# Patient Record
Sex: Male | Born: 1955 | Race: White | Hispanic: No | Marital: Married | Smoking: Current every day smoker
Health system: Southern US, Community
[De-identification: ages and names within clinical notes are randomized; demographics above are authoritative.]

## PROBLEM LIST (undated history)

## (undated) DIAGNOSIS — I2510 Atherosclerotic heart disease of native coronary artery without angina pectoris: Secondary | ICD-10-CM

## (undated) DIAGNOSIS — I10 Essential (primary) hypertension: Secondary | ICD-10-CM

## (undated) DIAGNOSIS — R739 Hyperglycemia, unspecified: Secondary | ICD-10-CM

## (undated) DIAGNOSIS — F17210 Nicotine dependence, cigarettes, uncomplicated: Secondary | ICD-10-CM

## (undated) DIAGNOSIS — F1010 Alcohol abuse, uncomplicated: Secondary | ICD-10-CM

## (undated) DIAGNOSIS — E119 Type 2 diabetes mellitus without complications: Secondary | ICD-10-CM

## (undated) DIAGNOSIS — E785 Hyperlipidemia, unspecified: Secondary | ICD-10-CM

## (undated) HISTORY — PX: CORONARY ANGIOPLASTY WITH STENT PLACEMENT: SHX49

## (undated) HISTORY — PX: LAPAROSCOPIC COLON RESECTION: SUR791

## (undated) HISTORY — PX: COLOSTOMY: SHX63

---

## 2004-03-29 ENCOUNTER — Other Ambulatory Visit: Payer: Self-pay

## 2004-04-05 ENCOUNTER — Inpatient Hospital Stay: Payer: Self-pay | Admitting: General Surgery

## 2004-04-17 ENCOUNTER — Other Ambulatory Visit: Payer: Self-pay

## 2004-05-01 ENCOUNTER — Emergency Department: Payer: Self-pay | Admitting: Emergency Medicine

## 2004-05-19 ENCOUNTER — Ambulatory Visit: Payer: Self-pay | Admitting: General Surgery

## 2004-07-24 ENCOUNTER — Ambulatory Visit: Payer: Self-pay | Admitting: General Surgery

## 2004-09-06 ENCOUNTER — Inpatient Hospital Stay: Payer: Self-pay | Admitting: General Surgery

## 2005-02-06 ENCOUNTER — Ambulatory Visit: Payer: Self-pay | Admitting: General Surgery

## 2005-03-10 IMAGING — CT CT ABD-PELV W/ CM
1 of 3 series · 15 of 32 positions shown, 19 images · non-contrast
Comparison: none

REASON FOR EXAM: back pn  SP colon surg
COMMENTS:

[Series 3: inspace · axial · 0.65mm/px · z∈[-213,+194]mm · 15 of 571 slices shown, 19 images]
[im 41/571  soft-tissue]
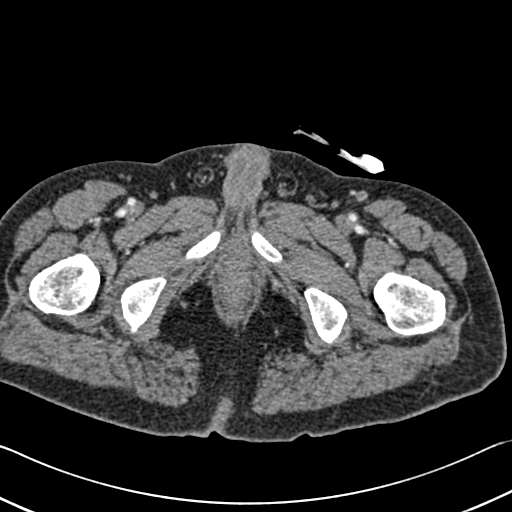
[im 41/571  bone]
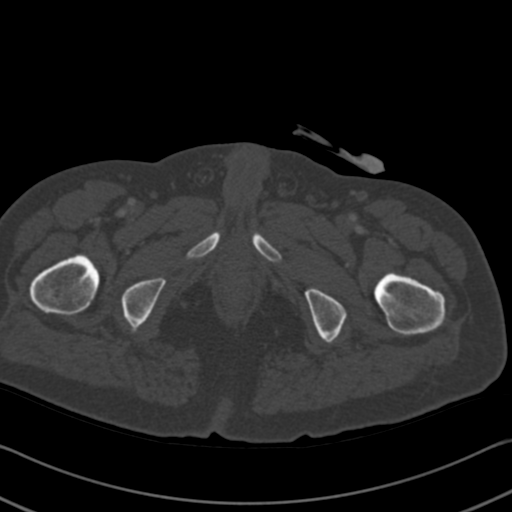
[im 82/571  soft-tissue]
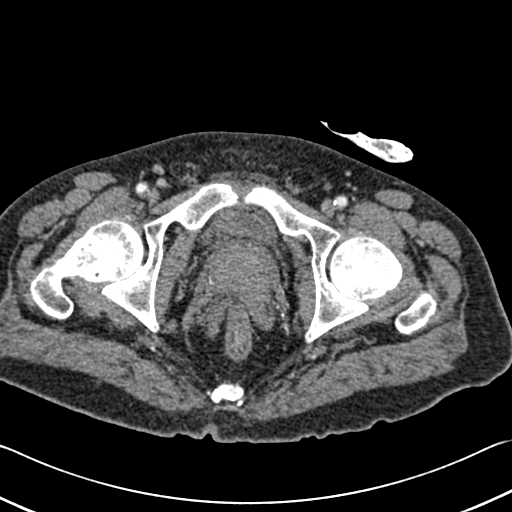
[im 123/571  soft-tissue]
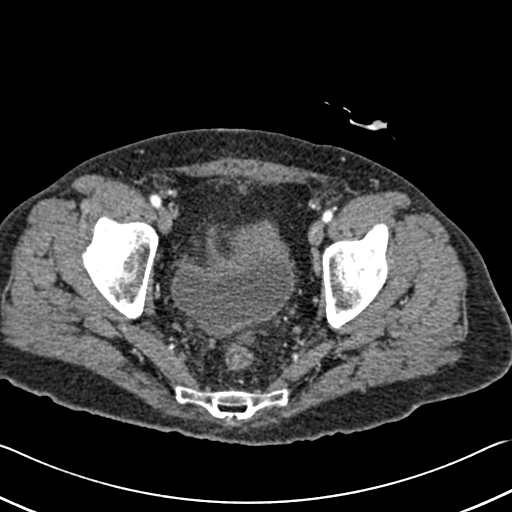
[im 163/571  soft-tissue]
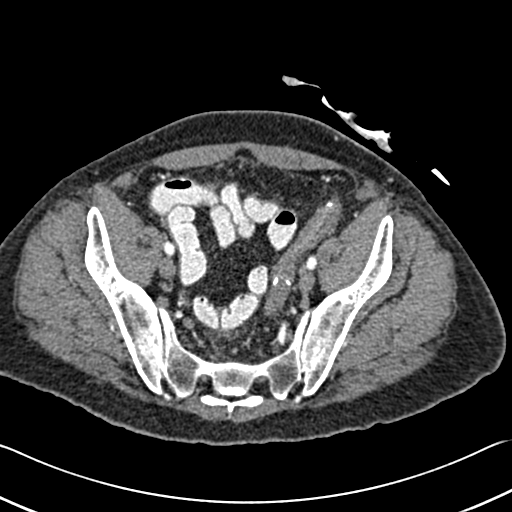
[im 204/571  soft-tissue]
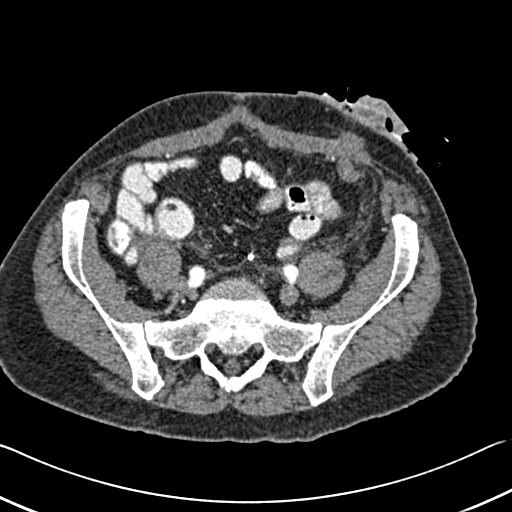
[im 245/571  soft-tissue]
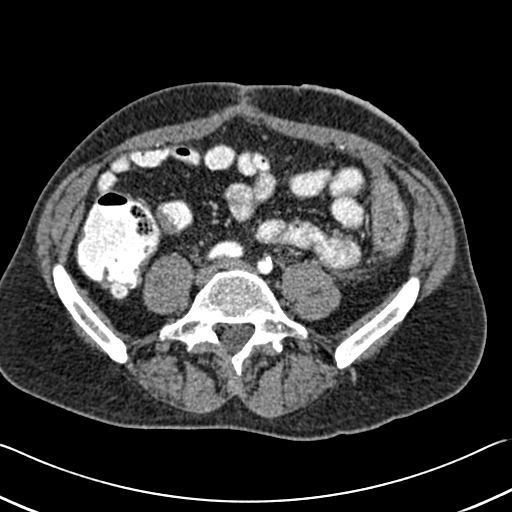
[im 286/571  soft-tissue]
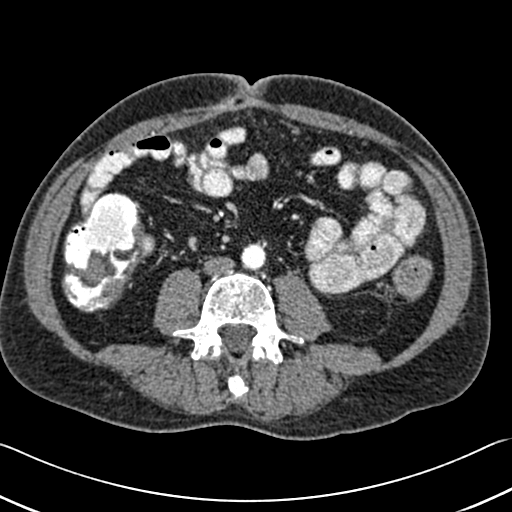
[im 326/571  soft-tissue]
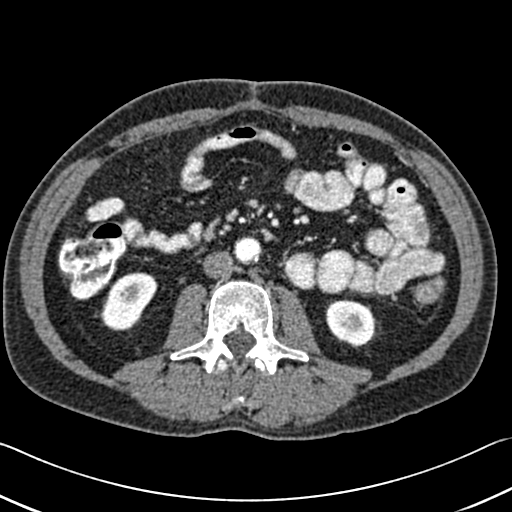
[im 367/571  soft-tissue]
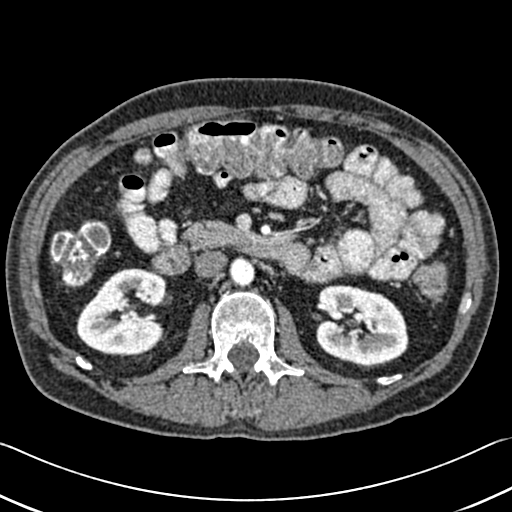
[im 367/571  bone]
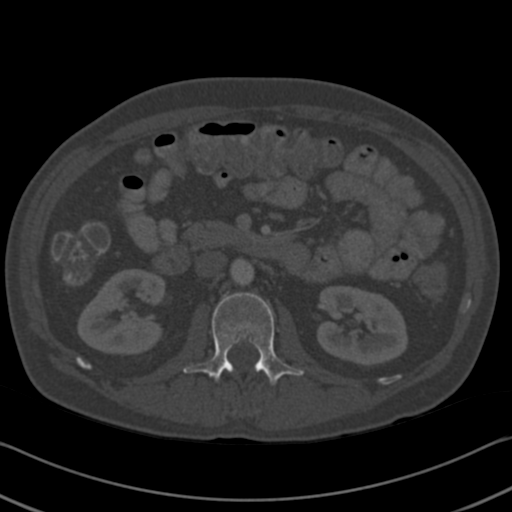
[im 408/571  soft-tissue]
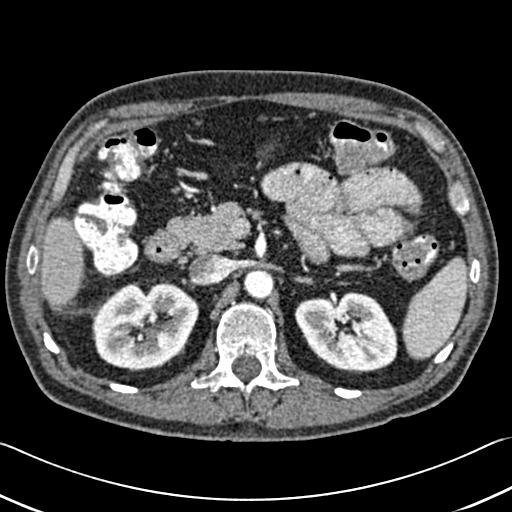
[im 448/571  soft-tissue]
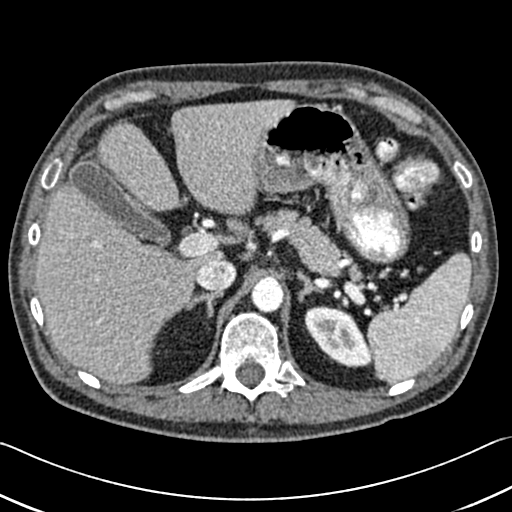
[im 489/571  soft-tissue]
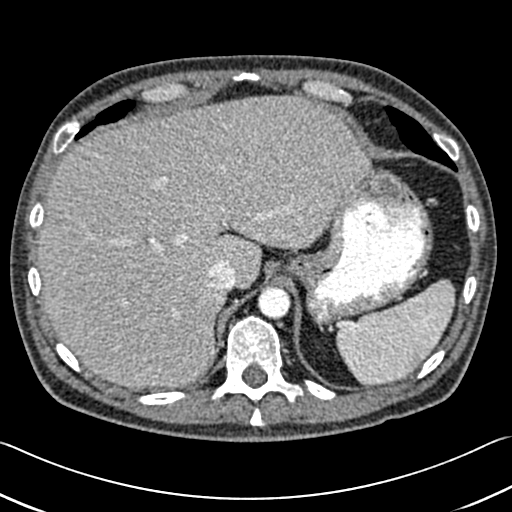
[im 489/571  lung]
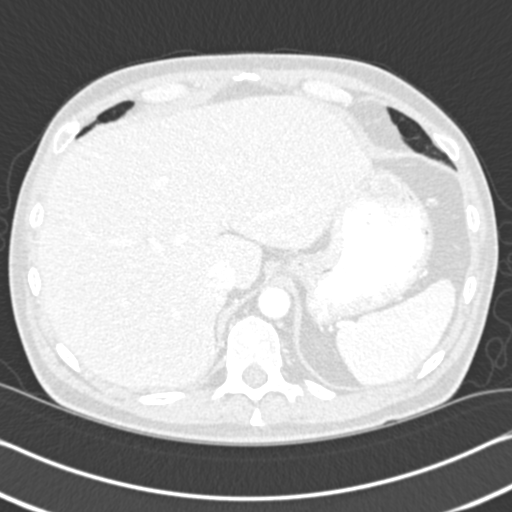
[im 509/571  lung]
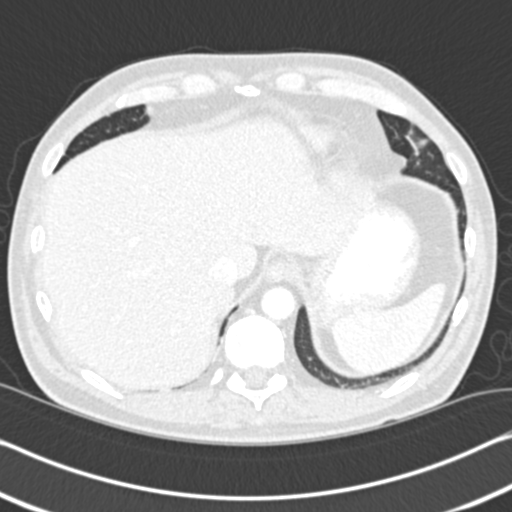
[im 530/571  soft-tissue]
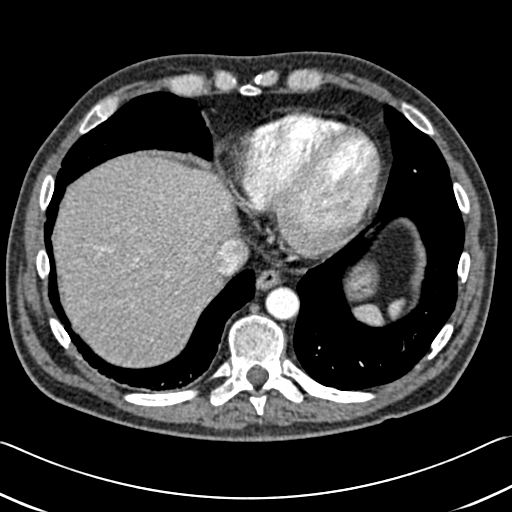
[im 530/571  lung]
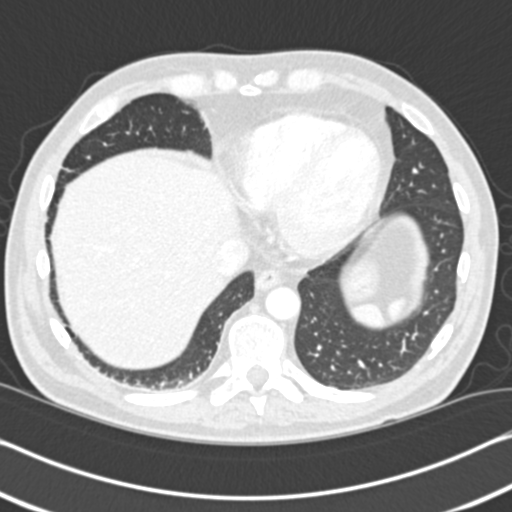
[im 550/571  lung]
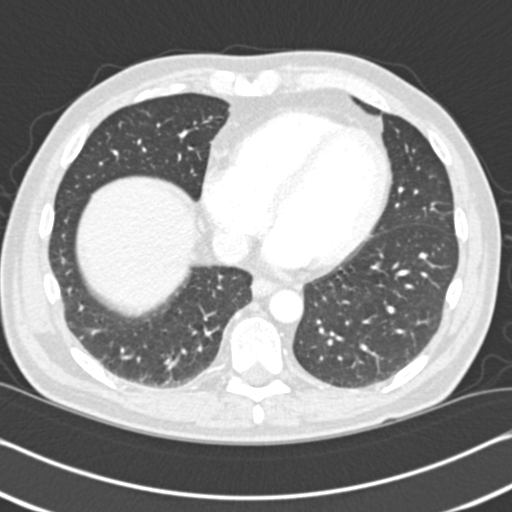

[15 of 32 positions shown; findings below may reference images not displayed]

PROCEDURE:     CT  - CT ABDOMEN / PELVIS  W  - May 19, 2004 [DATE]

RESULT:        8-mm helical cuts through the abdomen and pelvis were
performed with oral and 100 cc of [id number] contrast and compared with a
prior study from 04/12/04 which showed free air consistent with recent
surgery as well as fluid within the pelvis particularly in the LEFT lower
quadrant.
The lung bases are clear on today's study.  Cuts through the abdomen show a
normal appearing liver, spleen, gallbladder, pancreas, adrenals and kidneys.
 No free intraperitoneal fluid, air or adenopathy is identified.  There is a
significant improvement in the all appearance of the abdomen since previous
examination.  I no longer seen stranding of the mesenteric fat or free fluid
in either pericolic gutter.  There is peripheral calcification of the
abdominal aorta without evidence of aneurysmal dilatation.  The previously
described fluid collections within the pelvis have also resolved. The
bladder distends normally without evidence of filling defect or wall
thickening.  The bony structures show mild degenerative change without lytic
or blastic bony lesion.
IMPRESSION: Significant improvement in the all appearance of the abdomen and pelvis
since 04/12/04.  I no longer see evidence of free air or free intraperitoneal
fluid particularly the collections in the pelvis and LEFT lower quadrant
have resolved.  There has also been significant improvement in the overall
appearance of the mesenteric fat.  It was cloudy and stranded before and now
appears well within normal limits.

No new suspicious solid organ abnormality.

The lung bases are clear.

Mild degenerative bony changes without lytic or blastic bony lesion.

## 2007-12-03 ENCOUNTER — Other Ambulatory Visit: Payer: Self-pay

## 2007-12-03 ENCOUNTER — Inpatient Hospital Stay: Payer: Self-pay | Admitting: Internal Medicine

## 2011-03-09 ENCOUNTER — Encounter: Payer: Self-pay | Admitting: *Deleted

## 2012-05-21 ENCOUNTER — Ambulatory Visit: Payer: Self-pay | Admitting: General Surgery

## 2017-01-23 ENCOUNTER — Ambulatory Visit
Admission: RE | Admit: 2017-01-23 | Discharge: 2017-01-23 | Disposition: A | Discharge status: Home / Self Care | Payer: BLUE CROSS/BLUE SHIELD | Source: Ambulatory Visit | Attending: Unknown Physician Specialty | Admitting: Unknown Physician Specialty

## 2017-01-23 ENCOUNTER — Ambulatory Visit: Payer: BLUE CROSS/BLUE SHIELD | Admitting: Anesthesiology

## 2017-01-23 ENCOUNTER — Encounter
Admission: RE | Disposition: A | Discharge status: Home / Self Care | Payer: Self-pay | Source: Ambulatory Visit | Attending: Unknown Physician Specialty

## 2017-01-23 DIAGNOSIS — E785 Hyperlipidemia, unspecified: Secondary | ICD-10-CM | POA: Diagnosis not present

## 2017-01-23 DIAGNOSIS — I2510 Atherosclerotic heart disease of native coronary artery without angina pectoris: Secondary | ICD-10-CM | POA: Insufficient documentation

## 2017-01-23 DIAGNOSIS — Z955 Presence of coronary angioplasty implant and graft: Secondary | ICD-10-CM | POA: Diagnosis not present

## 2017-01-23 DIAGNOSIS — D120 Benign neoplasm of cecum: Secondary | ICD-10-CM | POA: Insufficient documentation

## 2017-01-23 DIAGNOSIS — J449 Chronic obstructive pulmonary disease, unspecified: Secondary | ICD-10-CM | POA: Diagnosis not present

## 2017-01-23 DIAGNOSIS — Z79899 Other long term (current) drug therapy: Secondary | ICD-10-CM | POA: Insufficient documentation

## 2017-01-23 DIAGNOSIS — Z7982 Long term (current) use of aspirin: Secondary | ICD-10-CM | POA: Diagnosis not present

## 2017-01-23 DIAGNOSIS — Z800 Family history of malignant neoplasm of digestive organs: Secondary | ICD-10-CM | POA: Diagnosis not present

## 2017-01-23 DIAGNOSIS — K621 Rectal polyp: Secondary | ICD-10-CM | POA: Diagnosis not present

## 2017-01-23 DIAGNOSIS — K648 Other hemorrhoids: Secondary | ICD-10-CM | POA: Insufficient documentation

## 2017-01-23 DIAGNOSIS — Z1211 Encounter for screening for malignant neoplasm of colon: Secondary | ICD-10-CM | POA: Diagnosis present

## 2017-01-23 DIAGNOSIS — Z86010 Personal history of colonic polyps: Secondary | ICD-10-CM | POA: Diagnosis not present

## 2017-01-23 DIAGNOSIS — F17210 Nicotine dependence, cigarettes, uncomplicated: Secondary | ICD-10-CM | POA: Diagnosis not present

## 2017-01-23 DIAGNOSIS — F329 Major depressive disorder, single episode, unspecified: Secondary | ICD-10-CM | POA: Diagnosis not present

## 2017-01-23 DIAGNOSIS — I10 Essential (primary) hypertension: Secondary | ICD-10-CM | POA: Diagnosis not present

## 2017-01-23 DIAGNOSIS — E119 Type 2 diabetes mellitus without complications: Secondary | ICD-10-CM | POA: Insufficient documentation

## 2017-01-23 DIAGNOSIS — D122 Benign neoplasm of ascending colon: Secondary | ICD-10-CM | POA: Insufficient documentation

## 2017-01-23 DIAGNOSIS — Z7984 Long term (current) use of oral hypoglycemic drugs: Secondary | ICD-10-CM | POA: Insufficient documentation

## 2017-01-23 HISTORY — PX: COLONOSCOPY WITH PROPOFOL: SHX5780

## 2017-01-23 HISTORY — DX: Hyperlipidemia, unspecified: E78.5

## 2017-01-23 HISTORY — DX: Essential (primary) hypertension: I10

## 2017-01-23 HISTORY — DX: Type 2 diabetes mellitus without complications: E11.9

## 2017-01-23 HISTORY — DX: Hyperglycemia, unspecified: R73.9

## 2017-01-23 HISTORY — DX: Nicotine dependence, cigarettes, uncomplicated: F17.210

## 2017-01-23 HISTORY — DX: Alcohol abuse, uncomplicated: F10.10

## 2017-01-23 HISTORY — DX: Atherosclerotic heart disease of native coronary artery without angina pectoris: I25.10

## 2017-01-23 LAB — GLUCOSE, CAPILLARY: GLUCOSE-CAPILLARY: 266 mg/dL — AB (ref 65–99)

## 2017-01-23 SURGERY — COLONOSCOPY WITH PROPOFOL
Anesthesia: General

## 2017-01-23 MED ORDER — LIDOCAINE HCL (PF) 1 % IJ SOLN: 2.0000 mL | Freq: Once | INTRAMUSCULAR | Status: DC

## 2017-01-23 MED ORDER — FENTANYL CITRATE (PF) 100 MCG/2ML IJ SOLN
INTRAMUSCULAR | Status: AC
  Filled 2017-01-23: qty 2

## 2017-01-23 MED ORDER — PHENYLEPHRINE HCL 10 MG/ML IJ SOLN
INTRAMUSCULAR | Status: AC
  Filled 2017-01-23: qty 1

## 2017-01-23 MED ORDER — SODIUM CHLORIDE 0.9 % IV SOLN: INTRAVENOUS | Status: DC

## 2017-01-23 MED ORDER — MIDAZOLAM HCL 2 MG/2ML IJ SOLN
INTRAMUSCULAR | Status: DC | PRN
  Administered 2017-01-23: 2 mg via INTRAVENOUS

## 2017-01-23 MED ORDER — MIDAZOLAM HCL 2 MG/2ML IJ SOLN
INTRAMUSCULAR | Status: AC
  Filled 2017-01-23: qty 2

## 2017-01-23 MED ORDER — SODIUM CHLORIDE 0.9 % IV SOLN
INTRAVENOUS | Status: DC
  Administered 2017-01-23: 09:00:00 via INTRAVENOUS

## 2017-01-23 MED ORDER — PROPOFOL 500 MG/50ML IV EMUL
INTRAVENOUS | Status: AC
  Filled 2017-01-23: qty 50

## 2017-01-23 MED ORDER — PHENYLEPHRINE HCL 10 MG/ML IJ SOLN
INTRAMUSCULAR | Status: DC | PRN
  Administered 2017-01-23: 100 ug via INTRAVENOUS

## 2017-01-23 MED ORDER — PROPOFOL 500 MG/50ML IV EMUL
INTRAVENOUS | Status: DC | PRN
  Administered 2017-01-23: 120 ug/kg/min via INTRAVENOUS

## 2017-01-23 MED ORDER — FENTANYL CITRATE (PF) 100 MCG/2ML IJ SOLN
INTRAMUSCULAR | Status: DC | PRN
  Administered 2017-01-23: 100 ug via INTRAVENOUS

## 2017-01-23 MED ORDER — PROPOFOL 10 MG/ML IV BOLUS
INTRAVENOUS | Status: DC | PRN
  Administered 2017-01-23: 30 mg via INTRAVENOUS

## 2017-01-23 NOTE — Anesthesia Preprocedure Evaluation (Signed)
Anesthesia Evaluation  Patient identified by MRN, date of birth, ID band Patient awake    Reviewed: Allergy & Precautions, NPO status , Patient's Chart, lab work & pertinent test results  Airway Mallampati: II       Dental  (+) Teeth Intact   Pulmonary COPD, Current Smoker,     + decreased breath sounds      Cardiovascular Exercise Tolerance: Good hypertension, Pt. on home beta blockers + CAD   Rhythm:Regular     Neuro/Psych Depression    GI/Hepatic negative GI ROS, Neg liver ROS,   Endo/Other  diabetes, Type 2, Oral Hypoglycemic Agents  Renal/GU negative Renal ROS     Musculoskeletal   Abdominal Normal abdominal exam  (+)   Peds negative pediatric ROS (+)  Hematology   Anesthesia Other Findings   Reproductive/Obstetrics                             Anesthesia Physical Anesthesia Plan  ASA: III  Anesthesia Plan: General   Post-op Pain Management:    Induction: Intravenous  PONV Risk Score and Plan: 0  Airway Management Planned: Natural Airway and Nasal Cannula  Additional Equipment:   Intra-op Plan:   Post-operative Plan:   Informed Consent: I have reviewed the patients History and Physical, chart, labs and discussed the procedure including the risks, benefits and alternatives for the proposed anesthesia with the patient or authorized representative who has indicated his/her understanding and acceptance.     Plan Discussed with: Surgeon  Anesthesia Plan Comments:         Anesthesia Quick Evaluation

## 2017-01-23 NOTE — Op Note (Signed)
[facility] Gastroenterology Patient Name: Adam Mitchell Procedure Date: 01/23/2017 9:01 AM MRN: [id number] Account #: [id number] Date of Birth: 03-19-1956 Admit Type: Outpatient Age: 61 Room: [facility] ENDO ROOM 3 Gender: Male Note Status: Finalized Procedure:            Colonoscopy Indications:          High risk colon cancer surveillance: Personal history                        of colonic polyps Providers:            Manya Silvas, MD Referring MD:         Kerin Perna MD, MD (Referring MD) Medicines:            Propofol per Anesthesia Complications:        No immediate complications. Procedure:            Pre-Anesthesia Assessment:                       - After reviewing the risks and benefits, the patient                        was deemed in satisfactory condition to undergo the                        procedure.                       After obtaining informed consent, the colonoscope was                        passed under direct vision. Throughout the procedure,                        the patient's blood pressure, pulse, and oxygen                        saturations were monitored continuously. The                        Colonoscope was introduced through the anus and                        advanced to the the cecum, identified by appendiceal                        orifice and ileocecal valve. The colonoscopy was                        performed without difficulty. The patient tolerated the                        procedure well. The quality of the bowel preparation                        was excellent. Findings:      Changes of previous surgery were seen, no impediment to the exam.      A diminutive polyp was found in the ascending colon. The polyp was       sessile. The polyp was removed with a jumbo cold forceps. Resection and  retrieval were complete.      A diminutive polyp was found in the rectum. The polyp was sessile. The       polyp was  removed with a jumbo cold forceps. Resection and retrieval       were complete. Impression:           - Two diminutive, non-bleeding polyps in the rectum and                        in the cecum, removed with a jumbo cold forceps.                        Resected and retrieved.                       - Internal hemorrhoids.                       - The examination was otherwise normal. Recommendation:       - Await pathology results. Manya Silvas, MD 01/23/2017 9:39:28 AM This report has been signed electronically. Number of Addenda: 0 Note Initiated On: 01/23/2017 9:01 AM Scope Withdrawal Time: 0 hours 7 minutes 58 seconds  Total Procedure Duration: 0 hours 16 minutes 0 seconds       [facility]

## 2017-01-23 NOTE — Transfer of Care (Signed)
Immediate Anesthesia Transfer of Care Note  Patient: Adam Mitchell  Procedure(s) Performed: Procedure(s): COLONOSCOPY WITH PROPOFOL (N/A)  Patient Location: PACU  Anesthesia Type:General  Level of Consciousness: awake  Airway & Oxygen Therapy: Patient Spontanous Breathing and Patient connected to nasal cannula oxygen  Post-op Assessment: Report given to RN and Post -op Vital signs reviewed and stable  Post vital signs: Reviewed  Last Vitals:  Vitals:   01/23/17 0930 01/23/17 0938  BP: 126/73   Pulse: 73   Resp: 14   Temp:  (!) 36.1 C    Last Pain:  Vitals:   01/23/17 0938  TempSrc: Tympanic         Complications: No apparent anesthesia complications

## 2017-01-23 NOTE — Anesthesia Postprocedure Evaluation (Signed)
Anesthesia Post Note  Patient: Adam Mitchell  Procedure(s) Performed: Procedure(s) (LRB): COLONOSCOPY WITH PROPOFOL (N/A)  Patient location during evaluation: PACU Anesthesia Type: General Level of consciousness: awake Pain management: pain level controlled Vital Signs Assessment: post-procedure vital signs reviewed and stable Respiratory status: spontaneous breathing Cardiovascular status: stable Anesthetic complications: no     Last Vitals:  Vitals:   01/23/17 0958 01/23/17 1008  BP: (!) 139/92 138/86  Pulse: 62 62  Resp: 16 17  Temp:      Last Pain:  Vitals:   01/23/17 0938  TempSrc: Tympanic                 VAN STAVEREN,Brittanya Winburn

## 2017-01-23 NOTE — Anesthesia Post-op Follow-up Note (Signed)
Anesthesia QCDR form completed.        

## 2017-01-23 NOTE — H&P (Signed)
Primary Care Physician:  Hortencia Pilar, MD Primary Gastroenterologist:  Dr. Vira Agar  Pre-Procedure History & Physical: HPI:  Adam Mitchell is a 61 y.o. male is here for an colonoscopy.   Past Medical History:  Diagnosis Date  . Alcohol abuse   . Cigarette smoker   . Coronary artery disease   . Diabetes mellitus without complication ([place])   . Hyperglycemia   . Hyperlipidemia   . Hypertension     Past Surgical History:  Procedure Laterality Date  . COLOSTOMY     due to diverticulitis   . CORONARY ANGIOPLASTY WITH STENT PLACEMENT     s/p placement of Xience stent in proximal LAD 12/05/07  . LAPAROSCOPIC COLON RESECTION     w/splenic takedown     Prior to Admission medications   Medication Sig Start Date End Date Taking? Authorizing Provider  aspirin EC 81 MG tablet Take 81 mg by mouth daily.   Yes [provider]  atenolol (TENORMIN) 25 MG tablet Take by mouth daily.   Yes [provider]  blood glucose meter kit and supplies by Other route as directed. Dispense based on patient and insurance preference. Use up to four times daily as directed. (FOR ICD-9 250.00, 250.01).   Yes [provider]  clopidogrel (PLAVIX) 75 MG tablet Take 75 mg by mouth daily.   Yes [provider]  glipiZIDE (GLUCOTROL) 5 MG tablet Take by mouth daily before breakfast.   Yes [provider]  glucose blood test strip 1 each by Other route as needed for other. Use as instructed   Yes [provider]  Lancets Misc. MISC by Does not apply route.   Yes [provider]  ramipril (ALTACE) 2.5 MG capsule Take 2.5 mg by mouth daily.   Yes [provider]  saxagliptin HCl (ONGLYZA) 2.5 MG TABS tablet Take 5 mg by mouth daily.   Yes [provider]  sildenafil (VIAGRA) 100 MG tablet Take 100 mg by mouth daily as needed for erectile dysfunction.   Yes [provider]  simvastatin (ZOCOR) 20 MG tablet Take 20 mg by mouth  daily.   Yes [provider]    Allergies as of 10/01/2016  . (Not on File)    Family History  Problem Relation Age of Onset  . Pancreatic cancer Mother   . Heart failure Father   . Heart failure Maternal Grandmother   . Depression Paternal Grandfather     Social History   Social History  . Marital status: Married    Spouse name: N/A  . Number of children: N/A  . Years of education: N/A   Occupational History  . Not on file.   Social History Main Topics  . Smoking status: Current Every Day Smoker    Packs/day: 2.00  . Smokeless tobacco: Never Used  . Alcohol use Yes  . Drug use: No  . Sexual activity: Not on file   Other Topics Concern  . Not on file   Social History Narrative  . No narrative on file    Review of Systems: See HPI, otherwise negative ROS  Physical Exam: BP (!) 105/52   Pulse 82   Temp (!) 96.7 F (35.9 C)   Resp 18   Ht '5\' 7"'  (1.702 m)   Wt 72.6 kg (160 lb)   SpO2 99%   BMI 25.06 kg/m  General:   Alert,  pleasant and cooperative in NAD Head:  Normocephalic and atraumatic. Neck:  Supple;  no masses or thyromegaly. Lungs:  Clear throughout to auscultation.    Heart:  Regular rate and rhythm. Abdomen:  Soft, nontender and nondistended. Normal bowel sounds, without guarding, and without rebound.   Neurologic:  Alert and  oriented x4;  grossly normal neurologically.  Impression/Plan: Adam Mitchell is here for an colonoscopy to be performed for [facility] colon polyps.  Risks, benefits, limitations, and alternatives regarding  colonoscopy have been reviewed with the patient.  Questions have been answered.  All parties agreeable.   Gaylyn Cheers, MD  01/23/2017, 9:05 AM

## 2017-01-24 ENCOUNTER — Encounter: Payer: Self-pay | Admitting: Unknown Physician Specialty

## 2017-01-24 LAB — SURGICAL PATHOLOGY

## 2019-12-30 ENCOUNTER — Other Ambulatory Visit: Payer: Self-pay | Admitting: Family Medicine

## 2019-12-30 DIAGNOSIS — E119 Type 2 diabetes mellitus without complications: Secondary | ICD-10-CM

## 2019-12-30 DIAGNOSIS — Z136 Encounter for screening for cardiovascular disorders: Secondary | ICD-10-CM

## 2020-01-08 ENCOUNTER — Ambulatory Visit
Admission: RE | Admit: 2020-01-08 | Discharge: 2020-01-08 | Disposition: A | Discharge status: Home / Self Care | Payer: Medicare Other | Source: Ambulatory Visit | Attending: Family Medicine | Admitting: Family Medicine

## 2020-01-08 ENCOUNTER — Other Ambulatory Visit: Payer: Self-pay

## 2020-01-08 DIAGNOSIS — E119 Type 2 diabetes mellitus without complications: Secondary | ICD-10-CM | POA: Insufficient documentation

## 2020-01-08 DIAGNOSIS — Z136 Encounter for screening for cardiovascular disorders: Secondary | ICD-10-CM | POA: Insufficient documentation

## 2020-01-12 ENCOUNTER — Other Ambulatory Visit: Payer: Self-pay | Admitting: Pediatrics

## 2022-03-02 ENCOUNTER — Other Ambulatory Visit: Payer: Self-pay | Admitting: *Deleted

## 2022-03-02 DIAGNOSIS — Z87891 Personal history of nicotine dependence: Secondary | ICD-10-CM

## 2022-03-02 DIAGNOSIS — Z122 Encounter for screening for malignant neoplasm of respiratory organs: Secondary | ICD-10-CM

## 2022-03-02 DIAGNOSIS — F17210 Nicotine dependence, cigarettes, uncomplicated: Secondary | ICD-10-CM

## 2022-03-20 ENCOUNTER — Encounter: Payer: Self-pay | Admitting: Ophthalmology

## 2022-03-20 ENCOUNTER — Other Ambulatory Visit: Payer: Self-pay

## 2022-03-21 NOTE — Discharge Instructions (Signed)

## 2022-03-27 ENCOUNTER — Encounter
Admission: RE | Disposition: A | Discharge status: Home / Self Care | Payer: Self-pay | Source: Home / Self Care | Attending: Ophthalmology

## 2022-03-27 ENCOUNTER — Encounter: Payer: Self-pay | Admitting: Ophthalmology

## 2022-03-27 ENCOUNTER — Ambulatory Visit: Payer: Medicare Other | Admitting: General Practice

## 2022-03-27 ENCOUNTER — Ambulatory Visit (AMBULATORY_SURGERY_CENTER): Payer: Medicare Other | Admitting: General Practice

## 2022-03-27 ENCOUNTER — Other Ambulatory Visit: Payer: Self-pay

## 2022-03-27 ENCOUNTER — Ambulatory Visit
Admission: RE | Admit: 2022-03-27 | Discharge: 2022-03-27 | Disposition: A | Discharge status: Home / Self Care | Payer: Medicare Other | Attending: Ophthalmology | Admitting: Ophthalmology

## 2022-03-27 DIAGNOSIS — I10 Essential (primary) hypertension: Secondary | ICD-10-CM | POA: Diagnosis not present

## 2022-03-27 DIAGNOSIS — E1136 Type 2 diabetes mellitus with diabetic cataract: Secondary | ICD-10-CM | POA: Insufficient documentation

## 2022-03-27 DIAGNOSIS — E785 Hyperlipidemia, unspecified: Secondary | ICD-10-CM | POA: Insufficient documentation

## 2022-03-27 DIAGNOSIS — H2512 Age-related nuclear cataract, left eye: Secondary | ICD-10-CM | POA: Diagnosis present

## 2022-03-27 DIAGNOSIS — I2510 Atherosclerotic heart disease of native coronary artery without angina pectoris: Secondary | ICD-10-CM | POA: Diagnosis not present

## 2022-03-27 DIAGNOSIS — F17210 Nicotine dependence, cigarettes, uncomplicated: Secondary | ICD-10-CM | POA: Diagnosis not present

## 2022-03-27 DIAGNOSIS — E119 Type 2 diabetes mellitus without complications: Secondary | ICD-10-CM | POA: Diagnosis not present

## 2022-03-27 HISTORY — PX: CATARACT EXTRACTION W/PHACO: SHX586

## 2022-03-27 LAB — GLUCOSE, CAPILLARY
Glucose-Capillary: 195 mg/dL — ABNORMAL HIGH (ref 70–99)
Glucose-Capillary: 209 mg/dL — ABNORMAL HIGH (ref 70–99)

## 2022-03-27 SURGERY — PHACOEMULSIFICATION, CATARACT, WITH IOL INSERTION
Anesthesia: Monitor Anesthesia Care | Site: Eye | Laterality: Left

## 2022-03-27 MED ORDER — SIGHTPATH DOSE#1 NA CHONDROIT SULF-NA HYALURON 40-17 MG/ML IO SOLN
INTRAOCULAR | Status: DC | PRN
  Administered 2022-03-27: 1 mL via INTRAOCULAR

## 2022-03-27 MED ORDER — SIGHTPATH DOSE#1 BSS IO SOLN
INTRAOCULAR | Status: DC | PRN
  Administered 2022-03-27: 1 mL via INTRAMUSCULAR

## 2022-03-27 MED ORDER — ARMC OPHTHALMIC DILATING DROPS
1.0000 | OPHTHALMIC | Status: DC | PRN
Start: 2022-03-27 — End: 2022-03-27
  Administered 2022-03-27 (×3): 1 via OPHTHALMIC

## 2022-03-27 MED ORDER — ONDANSETRON HCL 4 MG/2ML IJ SOLN: 4.0000 mg | Freq: Once | INTRAMUSCULAR | Status: DC | PRN

## 2022-03-27 MED ORDER — FENTANYL CITRATE PF 50 MCG/ML IJ SOSY: 25.0000 ug | PREFILLED_SYRINGE | INTRAMUSCULAR | Status: DC | PRN

## 2022-03-27 MED ORDER — BRIMONIDINE TARTRATE-TIMOLOL 0.2-0.5 % OP SOLN
OPHTHALMIC | Status: DC | PRN
  Administered 2022-03-27: 1 [drp] via OPHTHALMIC

## 2022-03-27 MED ORDER — FENTANYL CITRATE (PF) 100 MCG/2ML IJ SOLN
INTRAMUSCULAR | Status: DC | PRN
  Administered 2022-03-27 (×2): 50 ug via INTRAVENOUS

## 2022-03-27 MED ORDER — SIGHTPATH DOSE#1 BSS IO SOLN
INTRAOCULAR | Status: DC | PRN
  Administered 2022-03-27: 94 mL via OPHTHALMIC

## 2022-03-27 MED ORDER — MOXIFLOXACIN HCL 0.5 % OP SOLN
OPHTHALMIC | Status: DC | PRN
  Administered 2022-03-27: 0.2 mL via OPHTHALMIC

## 2022-03-27 MED ORDER — MIDAZOLAM HCL 2 MG/2ML IJ SOLN
INTRAMUSCULAR | Status: DC | PRN
  Administered 2022-03-27: 2 mg via INTRAVENOUS

## 2022-03-27 MED ORDER — SIGHTPATH DOSE#1 BSS IO SOLN
INTRAOCULAR | Status: DC | PRN
  Administered 2022-03-27: 15 mL

## 2022-03-27 MED ORDER — TETRACAINE HCL 0.5 % OP SOLN
1.0000 [drp] | OPHTHALMIC | Status: DC | PRN
  Administered 2022-03-27 (×3): 1 [drp] via OPHTHALMIC

## 2022-03-27 SURGICAL SUPPLY — 9 items
CATARACT SUITE SIGHTPATH (MISCELLANEOUS) ×1 IMPLANT
FEE CATARACT SUITE SIGHTPATH (MISCELLANEOUS) ×1 IMPLANT
GLOVE SURG ENC TEXT LTX SZ8 (GLOVE) ×1 IMPLANT
GLOVE SURG TRIUMPH 8.0 PF LTX (GLOVE) ×1 IMPLANT
LENS IOL TECNIS EYHANCE 18.0 (Intraocular Lens) IMPLANT
NDL FILTER BLUNT 18X1 1/2 (NEEDLE) ×1 IMPLANT
NEEDLE FILTER BLUNT 18X1 1/2 (NEEDLE) ×1 IMPLANT
SYR 3ML LL SCALE MARK (SYRINGE) ×1 IMPLANT
WATER STERILE IRR 250ML POUR (IV SOLUTION) ×1 IMPLANT

## 2022-03-27 NOTE — H&P (Signed)
[facility]   Primary Care Physician:  Sherre Scarlet, PA-C Ophthalmologist: Dr. George Ina  Pre-Procedure History & Physical: HPI:  Adam Mitchell is a 66 y.o. male here for cataract surgery.   Past Medical History:  Diagnosis Date   Alcohol abuse    Cigarette smoker    Coronary artery disease    Diabetes mellitus without complication ([place])    Hyperglycemia    Hyperlipidemia    Hypertension     Past Surgical History:  Procedure Laterality Date   COLONOSCOPY WITH PROPOFOL N/A 01/23/2017   Procedure: COLONOSCOPY WITH PROPOFOL;  Surgeon: Manya Silvas, MD;  Location: [facility] ENDOSCOPY;  Service: Endoscopy;  Laterality: N/A;   COLOSTOMY     due to diverticulitis    CORONARY ANGIOPLASTY WITH STENT PLACEMENT     s/p placement of Xience stent in proximal LAD 12/05/07   LAPAROSCOPIC COLON RESECTION     w/splenic takedown     Prior to Admission medications   Medication Sig Start Date End Date Taking? Authorizing Provider  aspirin EC 81 MG tablet Take 81 mg by mouth daily.   Yes [provider]  atenolol (TENORMIN) 25 MG tablet Take by mouth daily.   Yes [provider]  clopidogrel (PLAVIX) 75 MG tablet Take 75 mg by mouth daily.   Yes [provider]  empagliflozin (JARDIANCE) 10 MG TABS tablet Take by mouth daily.   Yes [provider]  glipiZIDE (GLUCOTROL) 5 MG tablet Take by mouth daily before breakfast.   Yes [provider]  glucose blood test strip 1 each by Other route as needed for other. Use as instructed   Yes [provider]  Lancets Misc. MISC by Does not apply route.   Yes [provider]  ramipril (ALTACE) 2.5 MG capsule Take 2.5 mg by mouth daily.   Yes [provider]  saxagliptin HCl (ONGLYZA) 2.5 MG TABS tablet Take 5 mg by mouth daily.   Yes [provider]  simvastatin (ZOCOR) 20 MG tablet Take 20 mg by mouth daily.   Yes [provider]  blood glucose meter  kit and supplies by Other route as directed. Dispense based on patient and insurance preference. Use up to four times daily as directed. (FOR ICD-9 250.00, 250.01).    [provider]  sildenafil (VIAGRA) 100 MG tablet Take 100 mg by mouth daily as needed for erectile dysfunction.    [provider]    Allergies as of 02/05/2022 - Review Complete 01/23/2017  Allergen Reaction Noted   Metformin and related  01/22/2017    Family History  Problem Relation Age of Onset   Pancreatic cancer Mother    Heart failure Father    Heart failure Maternal Grandmother    Depression Paternal Grandfather     Social History   Socioeconomic History   Marital status: Married    Spouse name: Not on file   Number of children: Not on file   Years of education: Not on file   Highest education level: Not on file  Occupational History   Not on file  Tobacco Use   Smoking status: Every Day    Packs/day: 2.00    Types: Cigarettes   Smokeless tobacco: Never  Substance and Sexual Activity   Alcohol use: Yes    Alcohol/week: 84.0 standard drinks of alcohol    Types: 84 Cans of beer per week   Drug use: No   Sexual activity: Not on file  Other  Topics Concern   Not on file  Social History Narrative   Not on file   Social Determinants of Health   Financial Resource Strain: Not on file  Food Insecurity: Not on file  Transportation Needs: Not on file  Physical Activity: Not on file  Stress: Not on file  Social Connections: Not on file  Intimate Partner Violence: Not on file    Review of Systems: See HPI, otherwise negative ROS  Physical Exam: BP (!) 163/80   Pulse 90   Temp (!) 97.3 F (36.3 C) (Temporal)   Ht _0  (1.676 m)   Wt 79.7 kg   SpO2 96%   BMI 28.34 kg/m  General:   Alert, cooperative in NAD Head:  Normocephalic and atraumatic. Respiratory:  Normal work of breathing. Cardiovascular:  RRR  Impression/Plan: Adam Mitchell is here for cataract  surgery.  Risks, benefits, limitations, and alternatives regarding cataract surgery have been reviewed with the patient.  Questions have been answered.  All parties agreeable.   Birder Robson, MD  03/27/2022, 7:55 AM

## 2022-03-27 NOTE — Anesthesia Postprocedure Evaluation (Signed)
Anesthesia Post Note  Patient: Adam Mitchell  Procedure(s) Performed: CATARACT EXTRACTION PHACO AND INTRAOCULAR LENS PLACEMENT (IOC) LEFT DIABETIC 8.22 00:58.3 (Left: Eye)     Anesthesia Type: MAC Anesthetic complications: no   There were no known notable events for this encounter.  Molli Barrows

## 2022-03-27 NOTE — Op Note (Signed)
PREOPERATIVE DIAGNOSIS:  Nuclear sclerotic cataract of the left eye.   POSTOPERATIVE DIAGNOSIS:  Nuclear sclerotic cataract of the left eye.   OPERATIVE PROCEDURE:ORPROCALL@   SURGEON:  Birder Robson, MD.   ANESTHESIA:  Anesthesiologist: Molli Barrows, MD CRNA: Ester Rink, CRNA  1.      Managed anesthesia care. 2.     0.31m of Shugarcaine was instilled following the paracentesis   COMPLICATIONS:  None.   TECHNIQUE:   Stop and chop   DESCRIPTION OF PROCEDURE:  The patient was examined and consented in the preoperative holding area where the aforementioned topical anesthesia was applied to the left eye and then brought back to the Operating Room where the left eye was prepped and draped in the usual sterile ophthalmic fashion and a lid speculum was placed. A paracentesis was created with the side port blade and the anterior chamber was filled with viscoelastic. A near clear corneal incision was performed with the steel keratome. A continuous curvilinear capsulorrhexis was performed with a cystotome followed by the capsulorrhexis forceps. Hydrodissection and hydrodelineation were carried out with BSS on a blunt cannula. The lens was removed in a stop and chop  technique and the remaining cortical material was removed with the irrigation-aspiration handpiece. The capsular bag was inflated with viscoelastic and the Technis [id number] lens was placed in the capsular bag without complication. The remaining viscoelastic was removed from the eye with the irrigation-aspiration handpiece. The wounds were hydrated. The anterior chamber was flushed with BSS and the eye was inflated to physiologic pressure. 0.110mVigamox was placed in the anterior chamber. The wounds were found to be water tight. The eye was dressed with Combigan. The patient was given protective glasses to wear throughout the day and a shield with which to sleep tonight. The patient was also given drops with which to begin a drop regimen  today and will follow-up with me in one day. Implant Name Type Inv. Item Serial No. Manufacturer Lot No. LRB No. Used Action  LENS IOL TECNIS EYHANCE 18.0 - [id number] Lens LENS IOL TECNIS EYHANCE 18.0 [id number]  Left 1 Implanted    Procedure(s) with comments: CATARACT EXTRACTION PHACO AND INTRAOCULAR LENS PLACEMENT (IOC) LEFT DIABETIC 8.22 00:58.3 (Left) - DIABETIC  Electronically signed: WiBirder Robson/19/2023 8:23 AM

## 2022-03-27 NOTE — Anesthesia Preprocedure Evaluation (Signed)
Anesthesia Evaluation  Patient identified by MRN, date of birth, ID band Patient awake    Reviewed: Allergy & Precautions, H&P , NPO status , Patient's Chart, lab work & pertinent test results, reviewed documented beta blocker date and time   Airway Mallampati: II  TM Distance: >3 FB Neck ROM: full    Dental no notable dental hx. (+) Teeth Intact   Pulmonary neg pulmonary ROS, Current Smoker,    Pulmonary exam normal breath sounds clear to auscultation       Cardiovascular Exercise Tolerance: Good hypertension, On Medications + CAD   Rhythm:regular Rate:Normal     Neuro/Psych negative neurological ROS  negative psych ROS   GI/Hepatic negative GI ROS, Neg liver ROS,   Endo/Other  negative endocrine ROSdiabetes, Well Controlled  Renal/GU      Musculoskeletal   Abdominal   Peds  Hematology negative hematology ROS (+)   Anesthesia Other Findings   Reproductive/Obstetrics negative OB ROS                             Anesthesia Physical Anesthesia Plan  ASA: 3  Anesthesia Plan: MAC   Post-op Pain Management:    Induction:   PONV Risk Score and Plan:   Airway Management Planned:   Additional Equipment:   Intra-op Plan:   Post-operative Plan:   Informed Consent: I have reviewed the patients History and Physical, chart, labs and discussed the procedure including the risks, benefits and alternatives for the proposed anesthesia with the patient or authorized representative who has indicated his/her understanding and acceptance.       Plan Discussed with: CRNA  Anesthesia Plan Comments:         Anesthesia Quick Evaluation

## 2022-03-27 NOTE — Transfer of Care (Signed)
Immediate Anesthesia Transfer of Care Note  Patient: Adam Mitchell  Procedure(s) Performed: CATARACT EXTRACTION PHACO AND INTRAOCULAR LENS PLACEMENT (IOC) LEFT DIABETIC 8.22 00:58.3 (Left: Eye)  Patient Location: PACU  Anesthesia Type: MAC  Level of Consciousness: awake, alert  and patient cooperative  Airway and Oxygen Therapy: Patient Spontanous Breathing and Patient connected to supplemental oxygen  Post-op Assessment: Post-op Vital signs reviewed, Patient's Cardiovascular Status Stable, Respiratory Function Stable, Patent Airway and No signs of Nausea or vomiting  Post-op Vital Signs: Reviewed and stable  Complications: There were no known notable events for this encounter.

## 2022-03-28 ENCOUNTER — Encounter: Payer: Self-pay | Admitting: Ophthalmology

## 2022-04-03 ENCOUNTER — Encounter: Payer: Self-pay | Admitting: Ophthalmology

## 2022-04-04 ENCOUNTER — Encounter: Payer: Self-pay | Admitting: Acute Care

## 2022-04-04 ENCOUNTER — Ambulatory Visit (INDEPENDENT_AMBULATORY_CARE_PROVIDER_SITE_OTHER): Payer: Medicare Other | Admitting: Acute Care

## 2022-04-04 DIAGNOSIS — F17210 Nicotine dependence, cigarettes, uncomplicated: Secondary | ICD-10-CM

## 2022-04-04 NOTE — Progress Notes (Signed)
Virtual Visit via Telephone Note  I connected with Adam Mitchell on 04/04/22 at 10:30 AM EDT by telephone and verified that I am speaking with the correct person using two identifiers.  Location: Patient:  At home Provider:  [address], [place], [place], [address]    I discussed the limitations, risks, security and privacy concerns of performing an evaluation and management service by telephone and the availability of in person appointments. I also discussed with the patient that there may be a patient responsible charge related to this service. The patient expressed understanding and agreed to proceed.   Shared Decision Making Visit Lung Cancer Screening Program [phone])   Eligibility: Age 66 y.o. Pack Years Smoking History Calculation 52 pack year smoking history (# packs/per year x # years smoked) Recent History of coughing up blood  no Unexplained weight loss? no ( >Than 15 pounds within the last 6 months ) Prior History Lung / other cancer no (Diagnosis within the last 5 years already requiring surveillance chest CT Scans). Smoking Status Current Smoker Former Smokers: Years since quit:  NA  Quit Date:  NA  Visit Components: Discussion included one or more decision making aids. yes Discussion included risk/benefits of screening. yes Discussion included potential follow up diagnostic testing for abnormal scans. yes Discussion included meaning and risk of over diagnosis. yes Discussion included meaning and risk of False Positives. yes Discussion included meaning of total radiation exposure. yes  Counseling Included: Importance of adherence to annual lung cancer LDCT screening. yes Impact of comorbidities on ability to participate in the program. yes Ability and willingness to under diagnostic treatment. yes  Smoking Cessation Counseling: Current Smokers:  Discussed importance of smoking cessation. yes Information about tobacco cessation classes and  interventions provided to patient. yes Patient provided with "ticket" for LDCT Scan. yes Symptomatic Patient. no  Counseling NA Diagnosis Code: Tobacco Use Z72.0 Asymptomatic Patient yes  Counseling (Intermediate counseling: > three minutes counseling) A6301 Former Smokers:  Discussed the importance of maintaining cigarette abstinence. yes Diagnosis Code: Personal History of Nicotine Dependence. S01.093 Information about tobacco cessation classes and interventions provided to patient. Yes Patient provided with "ticket" for LDCT Scan. yes Written Order for Lung Cancer Screening with LDCT placed in Epic. Yes (CT Chest Lung Cancer Screening Low Dose W/O CM) [id number] Z12.2-Screening of respiratory organs Z87.891-Personal history of nicotine dependence  I have spent 25 minutes of face to face/ virtual visit   time with  Mr. Mardene Celeste discussing the risks and benefits of lung cancer screening. We viewed / discussed a power point together that explained in detail the above noted topics. We paused at intervals to allow for questions to be asked and answered to ensure understanding.We discussed that the single most powerful action that he can take to decrease his risk of developing lung cancer is to quit smoking. We discussed whether or not he is ready to commit to setting a quit date. We discussed options for tools to aid in quitting smoking including nicotine replacement therapy, non-nicotine medications, support groups, Quit Smart classes, and behavior modification. We discussed that often times setting smaller, more achievable goals, such as eliminating 1 cigarette a day for a week and then 2 cigarettes a day for a week can be helpful in slowly decreasing the number of cigarettes smoked. This allows for a sense of accomplishment as well as providing a clinical benefit. I provided  him  with smoking cessation  information  with contact information for community resources, classes,  free nicotine replacement  therapy, and access to mobile apps, text messaging, and on-line smoking cessation help. I have also provided  him  the office contact information in the event he needs to contact me, or the screening staff. We discussed the time and location of the scan, and that either Doroteo Glassman RN, Joella Prince, RN  or I will call / send a letter with the results within 24-72 hours of receiving them. The patient verbalized understanding of all of  the above and had no further questions upon leaving the office. They have my contact information in the event they have any further questions.  I spent 3 minutes counseling on smoking cessation and the health risks of continued tobacco abuse.  I explained to the patient that there has been a high incidence of coronary artery disease noted on these exams. I explained that this is a non-gated exam therefore degree or severity cannot be determined. This patient is on statin therapy. I have asked the patient to follow-up with their PCP regarding any incidental finding of coronary artery disease and management with diet or medication as their PCP  feels is clinically indicated. The patient verbalized understanding of the above and had no further questions upon completion of the visit.      Magdalen Spatz, NP 04/04/2022

## 2022-04-04 NOTE — Discharge Instructions (Signed)

## 2022-04-04 NOTE — Patient Instructions (Signed)
Thank you for participating in the [facility]. It was our pleasure to meet you today. We will call you with the results of your scan within the next few days. Your scan will be assigned a Lung RADS category score by the physicians reading the scans.  This Lung RADS score determines follow up scanning.  See below for description of categories, and follow up screening recommendations. We will be in touch to schedule your follow up screening annually or based on recommendations of our providers. We will fax a copy of your scan results to your Primary Care Physician, or the physician who referred you to the program, to ensure they have the results. Please call the office if you have any questions or concerns regarding your scanning experience or results.  Our office number is [phone]. Please speak with Denise Phelps, RN. , or  Denise Buckner RN, They are  our Lung Cancer Screening RN.'s If They are unavailable when you call, Please leave a message on the voice mail. We will return your call at our earliest convenience.This voice mail is monitored several times a day.  Remember, if your scan is normal, we will scan you annually as long as you continue to meet the criteria for the program. (Age 55-77, Current smoker or smoker who has quit within the last 15 years). If you are a smoker, remember, quitting is the single most powerful action that you can take to decrease your risk of lung cancer and other pulmonary, breathing related problems. We know quitting is hard, and we are here to help.  Please let us know if there is anything we can do to help you meet your goal of quitting. If you are a former smoker, congratulations. We are proud of you! Remain smoke free! Remember you can refer friends or family members through the number above.  We will screen them to make sure they meet criteria for the program. Thank you for helping us take better care of you by  participating in Lung Screening.  You can receive free nicotine replacement therapy ( patches, gum or mints) by calling 1-800-QUIT NOW. Please call so we can get you on the path to becoming  a non-smoker. I know it is hard, but you can do this!  Lung RADS Categories:  Lung RADS 1: no nodules or definitely non-concerning nodules.  Recommendation is for a repeat annual scan in 12 months.  Lung RADS 2:  nodules that are non-concerning in appearance and behavior with a very low likelihood of becoming an active cancer. Recommendation is for a repeat annual scan in 12 months.  Lung RADS 3: nodules that are probably non-concerning , includes nodules with a low likelihood of becoming an active cancer.  Recommendation is for a 6-month repeat screening scan. Often noted after an upper respiratory illness. We will be in touch to make sure you have no questions, and to schedule your 6-month scan.  Lung RADS 4 A: nodules with concerning findings, recommendation is most often for a follow up scan in 3 months or additional testing based on our provider's assessment of the scan. We will be in touch to make sure you have no questions and to schedule the recommended 3 month follow up scan.  Lung RADS 4 B:  indicates findings that are concerning. We will be in touch with you to schedule additional diagnostic testing based on our provider's  assessment of the scan.  Other options for assistance in smoking cessation (   As covered by your insurance benefits)  Hypnosis for smoking cessation  [employer] [phone]  Acupuncture for smoking cessation  [facility] [phone]   

## 2022-04-06 ENCOUNTER — Ambulatory Visit
Admission: RE | Admit: 2022-04-06 | Discharge: 2022-04-06 | Disposition: A | Discharge status: Home / Self Care | Payer: Medicare Other | Source: Ambulatory Visit | Attending: Acute Care | Admitting: Acute Care

## 2022-04-06 DIAGNOSIS — F17210 Nicotine dependence, cigarettes, uncomplicated: Secondary | ICD-10-CM | POA: Diagnosis present

## 2022-04-06 DIAGNOSIS — Z122 Encounter for screening for malignant neoplasm of respiratory organs: Secondary | ICD-10-CM | POA: Diagnosis present

## 2022-04-06 DIAGNOSIS — Z87891 Personal history of nicotine dependence: Secondary | ICD-10-CM | POA: Insufficient documentation

## 2022-04-10 ENCOUNTER — Ambulatory Visit
Admission: RE | Admit: 2022-04-10 | Discharge: 2022-04-10 | Disposition: A | Discharge status: Home / Self Care | Payer: Medicare Other | Attending: Ophthalmology | Admitting: Ophthalmology

## 2022-04-10 ENCOUNTER — Ambulatory Visit: Payer: Medicare Other | Admitting: Anesthesiology

## 2022-04-10 ENCOUNTER — Other Ambulatory Visit: Payer: Self-pay | Admitting: Acute Care

## 2022-04-10 ENCOUNTER — Other Ambulatory Visit: Payer: Self-pay

## 2022-04-10 ENCOUNTER — Encounter: Payer: Self-pay | Admitting: Ophthalmology

## 2022-04-10 ENCOUNTER — Encounter
Admission: RE | Disposition: A | Discharge status: Home / Self Care | Payer: Self-pay | Source: Home / Self Care | Attending: Ophthalmology

## 2022-04-10 DIAGNOSIS — F17210 Nicotine dependence, cigarettes, uncomplicated: Secondary | ICD-10-CM

## 2022-04-10 DIAGNOSIS — Z87891 Personal history of nicotine dependence: Secondary | ICD-10-CM

## 2022-04-10 DIAGNOSIS — Z122 Encounter for screening for malignant neoplasm of respiratory organs: Secondary | ICD-10-CM

## 2022-04-10 DIAGNOSIS — E1136 Type 2 diabetes mellitus with diabetic cataract: Secondary | ICD-10-CM | POA: Diagnosis not present

## 2022-04-10 DIAGNOSIS — H2511 Age-related nuclear cataract, right eye: Secondary | ICD-10-CM | POA: Insufficient documentation

## 2022-04-10 HISTORY — PX: CATARACT EXTRACTION W/PHACO: SHX586

## 2022-04-10 LAB — GLUCOSE, CAPILLARY
Glucose-Capillary: 184 mg/dL — ABNORMAL HIGH (ref 70–99)
Glucose-Capillary: 186 mg/dL — ABNORMAL HIGH (ref 70–99)

## 2022-04-10 SURGERY — PHACOEMULSIFICATION, CATARACT, WITH IOL INSERTION
Anesthesia: Monitor Anesthesia Care | Site: Eye | Laterality: Right

## 2022-04-10 MED ORDER — MOXIFLOXACIN HCL 0.5 % OP SOLN
OPHTHALMIC | Status: DC | PRN
  Administered 2022-04-10: 0.2 mL via OPHTHALMIC

## 2022-04-10 MED ORDER — SIGHTPATH DOSE#1 BSS IO SOLN
INTRAOCULAR | Status: DC | PRN
  Administered 2022-04-10: 50 mL via OPHTHALMIC

## 2022-04-10 MED ORDER — MIDAZOLAM HCL 2 MG/2ML IJ SOLN
INTRAMUSCULAR | Status: DC | PRN
  Administered 2022-04-10: 2 mg via INTRAVENOUS

## 2022-04-10 MED ORDER — TETRACAINE HCL 0.5 % OP SOLN
1.0000 [drp] | OPHTHALMIC | Status: DC | PRN
  Administered 2022-04-10 (×3): 1 [drp] via OPHTHALMIC

## 2022-04-10 MED ORDER — ARMC OPHTHALMIC DILATING DROPS
1.0000 | OPHTHALMIC | Status: DC | PRN
Start: 2022-04-10 — End: 2022-04-10
  Administered 2022-04-10 (×3): 1 via OPHTHALMIC

## 2022-04-10 MED ORDER — FENTANYL CITRATE (PF) 100 MCG/2ML IJ SOLN
INTRAMUSCULAR | Status: DC | PRN
  Administered 2022-04-10: 100 ug via INTRAVENOUS

## 2022-04-10 MED ORDER — ACETAMINOPHEN 325 MG PO TABS: 325.0000 mg | ORAL_TABLET | ORAL | Status: DC | PRN

## 2022-04-10 MED ORDER — BRIMONIDINE TARTRATE-TIMOLOL 0.2-0.5 % OP SOLN
OPHTHALMIC | Status: DC | PRN
  Administered 2022-04-10: 1 [drp] via OPHTHALMIC

## 2022-04-10 MED ORDER — SIGHTPATH DOSE#1 BSS IO SOLN
INTRAOCULAR | Status: DC | PRN
  Administered 2022-04-10: 2 mL

## 2022-04-10 MED ORDER — ACETAMINOPHEN 160 MG/5ML PO SOLN: 325.0000 mg | ORAL | Status: DC | PRN

## 2022-04-10 MED ORDER — SIGHTPATH DOSE#1 NA CHONDROIT SULF-NA HYALURON 40-17 MG/ML IO SOLN
INTRAOCULAR | Status: DC | PRN
  Administered 2022-04-10: 1 mL via INTRAOCULAR

## 2022-04-10 MED ORDER — SIGHTPATH DOSE#1 BSS IO SOLN
INTRAOCULAR | Status: DC | PRN
  Administered 2022-04-10: 15 mL via INTRAOCULAR

## 2022-04-10 SURGICAL SUPPLY — 11 items
CANNULA ANT/CHMB 27G (MISCELLANEOUS) IMPLANT
CANNULA ANT/CHMB 27GA (MISCELLANEOUS) IMPLANT
CATARACT SUITE SIGHTPATH (MISCELLANEOUS) ×1 IMPLANT
FEE CATARACT SUITE SIGHTPATH (MISCELLANEOUS) ×1 IMPLANT
GLOVE SURG ENC TEXT LTX SZ8 (GLOVE) ×1 IMPLANT
GLOVE SURG TRIUMPH 8.0 PF LTX (GLOVE) ×1 IMPLANT
LENS IOL TECNIS EYHANCE 20.0 (Intraocular Lens) IMPLANT
NDL FILTER BLUNT 18X1 1/2 (NEEDLE) ×1 IMPLANT
NEEDLE FILTER BLUNT 18X1 1/2 (NEEDLE) ×1 IMPLANT
SYR 3ML LL SCALE MARK (SYRINGE) ×1 IMPLANT
WATER STERILE IRR 250ML POUR (IV SOLUTION) ×1 IMPLANT

## 2022-04-10 NOTE — H&P (Signed)
[facility]   Primary Care Physician:  Powell, Mychal Kelly, PA-C Ophthalmologist: Dr. Bradley King  Pre-Procedure History & Physical: HPI:  Adam Mitchell is a 66 y.o. male here for cataract surgery.   Past Medical History:  Diagnosis Date   Alcohol abuse    Cigarette smoker    Coronary artery disease    Diabetes mellitus without complication (HCC)    Hyperglycemia    Hyperlipidemia    Hypertension     Past Surgical History:  Procedure Laterality Date   CATARACT EXTRACTION W/PHACO Left 03/27/2022   Procedure: CATARACT EXTRACTION PHACO AND INTRAOCULAR LENS PLACEMENT (IOC) LEFT DIABETIC 8.22 00:58.3;  Surgeon: Porfilio, William, MD;  Location: [facility];  Service: Ophthalmology;  Laterality: Left;  DIABETIC   COLONOSCOPY WITH PROPOFOL N/A 01/23/2017   Procedure: COLONOSCOPY WITH PROPOFOL;  Surgeon: Elliott, Robert T, MD;  Location: [facility];  Service: Endoscopy;  Laterality: N/A;   COLOSTOMY     due to diverticulitis    CORONARY ANGIOPLASTY WITH STENT PLACEMENT     s/p placement of Xience stent in proximal LAD 12/05/07   LAPAROSCOPIC COLON RESECTION     w/splenic takedown     Prior to Admission medications   Medication Sig Start Date End Date Taking? Authorizing Provider  aspirin EC 81 MG tablet Take 81 mg by mouth daily.   Yes [provider]  atenolol (TENORMIN) 25 MG tablet Take by mouth daily.   Yes [provider]  blood glucose meter kit and supplies by Other route as directed. Dispense based on patient and insurance preference. Use up to four times daily as directed. (FOR ICD-9 250.00, 250.01).   Yes [provider]  empagliflozin (JARDIANCE) 10 MG TABS tablet Take by mouth daily.   Yes [provider]  glipiZIDE (GLUCOTROL) 5 MG tablet Take by mouth daily before breakfast.   Yes [provider]  pioglitazone (ACTOS) 15 MG tablet Take 15 mg by mouth daily as needed.   Yes [provider]   ramipril (ALTACE) 2.5 MG capsule Take 2.5 mg by mouth daily.   Yes [provider]  saxagliptin HCl (ONGLYZA) 2.5 MG TABS tablet Take 5 mg by mouth daily.   Yes [provider]  sildenafil (VIAGRA) 100 MG tablet Take 100 mg by mouth daily as needed for erectile dysfunction.   Yes [provider]  simvastatin (ZOCOR) 20 MG tablet Take 20 mg by mouth daily.   Yes [provider]  glucose blood test strip 1 each by Other route as needed for other. Use as instructed    [provider]  Lancets Misc. MISC by Does not apply route.    [provider]    Allergies as of 02/05/2022 - Review Complete 01/23/2017  Allergen Reaction Noted   Metformin and related  01/22/2017    Family History  Problem Relation Age of Onset   Pancreatic cancer Mother    Heart failure Father    Heart failure Maternal Grandmother    Depression Paternal Grandfather     Social History   Socioeconomic History   Marital status: Married    Spouse name: Not on file   Number of children: Not on file   Years of education: Not on file   Highest education level: Not on file  Occupational History   Not on file  Tobacco Use   Smoking status: Every Day    Packs/day: 2.00    Types: Cigarettes   Smokeless tobacco: Never    Substance and Sexual Activity   Alcohol use: Yes    Alcohol/week: 84.0 standard drinks of alcohol    Types: 84 Cans of beer per week   Drug use: No   Sexual activity: Not on file  Other Topics Concern   Not on file  Social History Narrative   Not on file   Social Determinants of Health   Financial Resource Strain: Not on file  Food Insecurity: Not on file  Transportation Needs: Not on file  Physical Activity: Not on file  Stress: Not on file  Social Connections: Not on file  Intimate Partner Violence: Not on file    Review of Systems: See HPI, otherwise negative ROS  Physical Exam: BP (!) 152/76   Pulse 66   Temp 97.6 F (36.4  C) (Temporal)   Ht 5' 6" (1.676 m)   Wt 78.9 kg   SpO2 96%   BMI 28.08 kg/m  General:   Alert, cooperative in NAD Head:  Normocephalic and atraumatic. Respiratory:  Normal work of breathing. Cardiovascular:  RRR  Impression/Plan: Adam Mitchell is here for cataract surgery.  Risks, benefits, limitations, and alternatives regarding cataract surgery have been reviewed with the patient.  Questions have been answered.  All parties agreeable.   Birder Robson, MD  04/10/2022, 9:31 AM

## 2022-04-10 NOTE — Anesthesia Preprocedure Evaluation (Signed)
Anesthesia Evaluation  Patient identified by MRN, date of birth, ID band Patient awake    Reviewed: Allergy & Precautions, H&P , NPO status , Patient's Chart, lab work & pertinent test results, reviewed documented beta blocker date and time   History of Anesthesia Complications Negative for: history of anesthetic complications  Airway Mallampati: II  TM Distance: >3 FB Neck ROM: full    Dental no notable dental hx. (+) Teeth Intact   Pulmonary neg pulmonary ROS, Current Smoker and Patient abstained from smoking.,    Pulmonary exam normal breath sounds clear to auscultation       Cardiovascular Exercise Tolerance: Good hypertension, On Medications + CAD   Rhythm:regular Rate:Normal     Neuro/Psych negative neurological ROS  negative psych ROS   GI/Hepatic negative GI ROS, Neg liver ROS,   Endo/Other  diabetes, Well Controlled  Renal/GU      Musculoskeletal   Abdominal   Peds  Hematology negative hematology ROS (+)   Anesthesia Other Findings   Reproductive/Obstetrics negative OB ROS                             Anesthesia Physical  Anesthesia Plan  ASA: 3  Anesthesia Plan: MAC   Post-op Pain Management: Minimal or no pain anticipated   Induction: Intravenous  PONV Risk Score and Plan:   Airway Management Planned: Natural Airway and Nasal Cannula  Additional Equipment:   Intra-op Plan:   Post-operative Plan:   Informed Consent: I have reviewed the patients History and Physical, chart, labs and discussed the procedure including the risks, benefits and alternatives for the proposed anesthesia with the patient or authorized representative who has indicated his/her understanding and acceptance.     Dental Advisory Given  Plan Discussed with: CRNA  Anesthesia Plan Comments: (Patient consented for risks of anesthesia including but not limited to:  - adverse reactions to  medications - damage to eyes, teeth, lips or other oral mucosa - nerve damage due to positioning  - sore throat or hoarseness - Damage to heart, brain, nerves, lungs, other parts of body or loss of life  Patient voiced understanding.)        Anesthesia Quick Evaluation

## 2022-04-10 NOTE — Anesthesia Postprocedure Evaluation (Signed)
Anesthesia Post Note  Patient: OMERO KOWAL  Procedure(s) Performed: CATARACT EXTRACTION PHACO AND INTRAOCULAR LENS PLACEMENT (IOC) RIGHT DIABETIC 7.86 00:43.8 (Right: Eye)  Patient location during evaluation: PACU Anesthesia Type: MAC Level of consciousness: awake and alert Pain management: pain level controlled Vital Signs Assessment: post-procedure vital signs reviewed and stable Respiratory status: spontaneous breathing, nonlabored ventilation, respiratory function stable and patient connected to nasal cannula oxygen Cardiovascular status: stable and blood pressure returned to baseline Postop Assessment: no apparent nausea or vomiting Anesthetic complications: no   No notable events documented.   Last Vitals:  Vitals:   04/10/22 0957 04/10/22 1003  BP: 113/67 119/75  Pulse: 72 76  Resp: 12 20  Temp: (!) 36.1 C (!) 36.1 C  SpO2: 93% 94%    Last Pain:  Vitals:   04/10/22 1003  TempSrc:   PainSc: 0-No pain                 Ilene Qua

## 2022-04-10 NOTE — Op Note (Signed)
PREOPERATIVE DIAGNOSIS:  Nuclear sclerotic cataract of the right eye.   POSTOPERATIVE DIAGNOSIS:  CATARACT   OPERATIVE PROCEDURE:ORPROCALL@   SURGEON:  Birder Robson, MD.   ANESTHESIA:  Anesthesiologist: Ilene Qua, MD CRNA: Tobie Poet, CRNA  1.      Managed anesthesia care. 2.      0.39m of Shugarcaine was instilled in the eye following the paracentesis.   COMPLICATIONS:  None.   TECHNIQUE:   Stop and chop   DESCRIPTION OF PROCEDURE:  The patient was examined and consented in the preoperative holding area where the aforementioned topical anesthesia was applied to the right eye and then brought back to the Operating Room where the right eye was prepped and draped in the usual sterile ophthalmic fashion and a lid speculum was placed. A paracentesis was created with the side port blade and the anterior chamber was filled with viscoelastic. A near clear corneal incision was performed with the steel keratome. A continuous curvilinear capsulorrhexis was performed with a cystotome followed by the capsulorrhexis forceps. Hydrodissection and hydrodelineation were carried out with BSS on a blunt cannula. The lens was removed in a stop and chop  technique and the remaining cortical material was removed with the irrigation-aspiration handpiece. The capsular bag was inflated with viscoelastic and the Technis [id number]  lens was placed in the capsular bag without complication. The remaining viscoelastic was removed from the eye with the irrigation-aspiration handpiece. The wounds were hydrated. The anterior chamber was flushed with BSS and the eye was inflated to physiologic pressure. 0.153mof Vigamox was placed in the anterior chamber. The wounds were found to be water tight. The eye was dressed with Combigan. The patient was given protective glasses to wear throughout the day and a shield with which to sleep tonight. The patient was also given drops with which to begin a drop regimen today and  will follow-up with me in one day. Implant Name Type Inv. Item Serial No. Manufacturer Lot No. LRB No. Used Action  LENS IOL TECNIS EYHANCE 20.0 - [id number] Lens LENS IOL TECNIS EYHANCE 20.0 [id number]  Right 1 Implanted   Procedure(s) with comments: CATARACT EXTRACTION PHACO AND INTRAOCULAR LENS PLACEMENT (IOC) RIGHT DIABETIC 7.86 00:43.8 (Right) - Diabetic  Electronically signed: WiBirder Robson0/09/2021 9:56 AM

## 2022-04-10 NOTE — Transfer of Care (Signed)
Immediate Anesthesia Transfer of Care Note  Patient: Adam Mitchell  Procedure(s) Performed: CATARACT EXTRACTION PHACO AND INTRAOCULAR LENS PLACEMENT (IOC) RIGHT DIABETIC 7.86 00:43.8 (Right: Eye)  Patient Location: PACU  Anesthesia Type: MAC  Level of Consciousness: awake, alert  and patient cooperative  Airway and Oxygen Therapy: Patient Spontanous Breathing and Patient connected to supplemental oxygen  Post-op Assessment: Post-op Vital signs reviewed, Patient's Cardiovascular Status Stable, Respiratory Function Stable, Patent Airway and No signs of Nausea or vomiting  Post-op Vital Signs: Reviewed and stable  Complications: No notable events documented.

## 2022-04-11 ENCOUNTER — Encounter: Payer: Self-pay | Admitting: Ophthalmology

## 2023-04-08 ENCOUNTER — Ambulatory Visit
Admission: RE | Admit: 2023-04-08 | Discharge: 2023-04-08 | Disposition: A | Discharge status: Home / Self Care | Payer: Medicare Other | Source: Ambulatory Visit | Attending: Acute Care | Admitting: Acute Care

## 2023-04-08 ENCOUNTER — Ambulatory Visit: Payer: Medicare Other

## 2023-04-08 DIAGNOSIS — F17210 Nicotine dependence, cigarettes, uncomplicated: Secondary | ICD-10-CM | POA: Diagnosis present

## 2023-04-08 DIAGNOSIS — Z122 Encounter for screening for malignant neoplasm of respiratory organs: Secondary | ICD-10-CM | POA: Insufficient documentation

## 2023-04-08 DIAGNOSIS — Z87891 Personal history of nicotine dependence: Secondary | ICD-10-CM | POA: Diagnosis present

## 2023-04-24 ENCOUNTER — Other Ambulatory Visit: Payer: Self-pay | Admitting: Acute Care

## 2023-04-24 DIAGNOSIS — Z122 Encounter for screening for malignant neoplasm of respiratory organs: Secondary | ICD-10-CM

## 2023-04-24 DIAGNOSIS — Z87891 Personal history of nicotine dependence: Secondary | ICD-10-CM

## 2024-04-08 ENCOUNTER — Ambulatory Visit

## 2024-05-11 ENCOUNTER — Other Ambulatory Visit: Payer: Self-pay | Admitting: *Deleted

## 2024-05-11 DIAGNOSIS — F17210 Nicotine dependence, cigarettes, uncomplicated: Secondary | ICD-10-CM

## 2024-05-11 DIAGNOSIS — Z87891 Personal history of nicotine dependence: Secondary | ICD-10-CM

## 2024-05-11 DIAGNOSIS — Z122 Encounter for screening for malignant neoplasm of respiratory organs: Secondary | ICD-10-CM

## 2024-05-14 ENCOUNTER — Ambulatory Visit
Admission: RE | Admit: 2024-05-14 | Discharge: 2024-05-14 | Disposition: A | Discharge status: Home / Self Care | Source: Ambulatory Visit | Attending: Acute Care | Admitting: Acute Care

## 2024-05-14 DIAGNOSIS — Z122 Encounter for screening for malignant neoplasm of respiratory organs: Secondary | ICD-10-CM | POA: Insufficient documentation

## 2024-05-14 DIAGNOSIS — F17210 Nicotine dependence, cigarettes, uncomplicated: Secondary | ICD-10-CM | POA: Insufficient documentation

## 2024-05-14 DIAGNOSIS — Z87891 Personal history of nicotine dependence: Secondary | ICD-10-CM | POA: Insufficient documentation

## 2024-05-20 ENCOUNTER — Other Ambulatory Visit: Payer: Self-pay

## 2024-05-20 DIAGNOSIS — Z122 Encounter for screening for malignant neoplasm of respiratory organs: Secondary | ICD-10-CM

## 2024-05-20 DIAGNOSIS — F17210 Nicotine dependence, cigarettes, uncomplicated: Secondary | ICD-10-CM

## 2024-05-20 DIAGNOSIS — Z87891 Personal history of nicotine dependence: Secondary | ICD-10-CM
